# Patient Record
Sex: Female | Born: 1970 | Race: White | Hispanic: No | Smoking: Current every day smoker
Health system: Southern US, Community
[De-identification: ages and names within clinical notes are randomized; demographics above are authoritative.]

## PROBLEM LIST (undated history)

## (undated) DIAGNOSIS — IMO0001 Reserved for inherently not codable concepts without codable children: Secondary | ICD-10-CM

## (undated) DIAGNOSIS — K56609 Unspecified intestinal obstruction, unspecified as to partial versus complete obstruction: Secondary | ICD-10-CM

## (undated) DIAGNOSIS — K219 Gastro-esophageal reflux disease without esophagitis: Secondary | ICD-10-CM

## (undated) DIAGNOSIS — F32A Depression, unspecified: Secondary | ICD-10-CM

## (undated) DIAGNOSIS — N200 Calculus of kidney: Secondary | ICD-10-CM

## (undated) DIAGNOSIS — J189 Pneumonia, unspecified organism: Secondary | ICD-10-CM

## (undated) DIAGNOSIS — F4310 Post-traumatic stress disorder, unspecified: Secondary | ICD-10-CM

## (undated) DIAGNOSIS — F329 Major depressive disorder, single episode, unspecified: Secondary | ICD-10-CM

## (undated) HISTORY — PX: ABDOMINAL SURGERY: SHX537

## (undated) HISTORY — PX: APPENDECTOMY: SHX54

---

## 2005-11-04 ENCOUNTER — Inpatient Hospital Stay (HOSPITAL_COMMUNITY): Admission: AD | Admit: 2005-11-04 | Discharge: 2005-11-06 | Payer: Self-pay | Admitting: Obstetrics and Gynecology

## 2006-01-15 ENCOUNTER — Other Ambulatory Visit: Admission: RE | Admit: 2006-01-15 | Discharge: 2006-01-15 | Payer: Self-pay | Admitting: Obstetrics and Gynecology

## 2006-04-12 ENCOUNTER — Emergency Department (HOSPITAL_COMMUNITY): Admission: EM | Admit: 2006-04-12 | Discharge: 2006-04-12 | Payer: Self-pay | Admitting: Emergency Medicine

## 2007-09-21 ENCOUNTER — Ambulatory Visit (HOSPITAL_COMMUNITY): Admission: RE | Admit: 2007-09-21 | Discharge: 2007-09-21 | Payer: Self-pay | Admitting: Obstetrics and Gynecology

## 2007-09-22 ENCOUNTER — Encounter (INDEPENDENT_AMBULATORY_CARE_PROVIDER_SITE_OTHER): Payer: Self-pay | Admitting: Obstetrics & Gynecology

## 2007-09-22 ENCOUNTER — Ambulatory Visit (HOSPITAL_COMMUNITY): Admission: RE | Admit: 2007-09-22 | Discharge: 2007-09-22 | Payer: Self-pay | Admitting: Obstetrics & Gynecology

## 2008-06-08 ENCOUNTER — Ambulatory Visit (HOSPITAL_COMMUNITY): Admission: RE | Admit: 2008-06-08 | Discharge: 2008-06-08 | Payer: Self-pay | Admitting: Obstetrics and Gynecology

## 2008-11-30 IMAGING — CT CT ABDOMEN W/ CM
3 of 5 series · 14 of 32 positions shown, 19 images · IV contrast ([ID] READICAT & [ID] OMNIP 300%)
Comparison: Noncontrast CT on 04/12/2006

CT ABDOMEN

CLINICAL DATA: Left lower quadrant abdominal pain.  Left-sided
pelvic pain.  Abdominal distension and previous bowel obstructions.
Prior appendectomy.

CT ABDOMEN AND PELVIS WITH CONTRAST
TECHNIQUE: Multidetector CT imaging of the abdomen and pelvis was
performed using the standard protocol following bolus
administration of intravenous contrast.
Contrast: 100 ml Nmnipaque-5II and oral contrast

[Series 2: abd pelvis · axial · 0.73mm/px · z∈[-394,-124]mm · 4 of 92 slices shown, 9 images]
[im 19/92  soft-tissue]
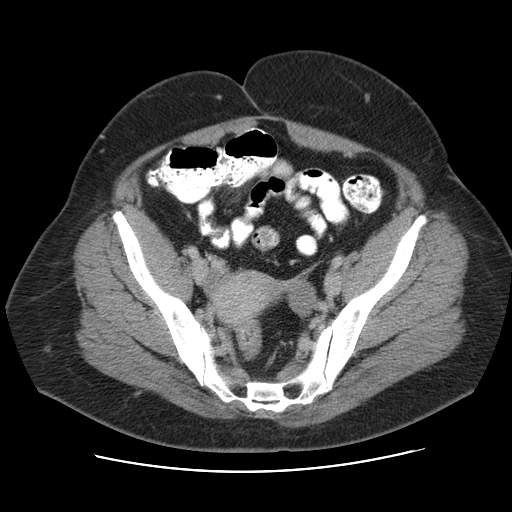
[im 19/92  lung]
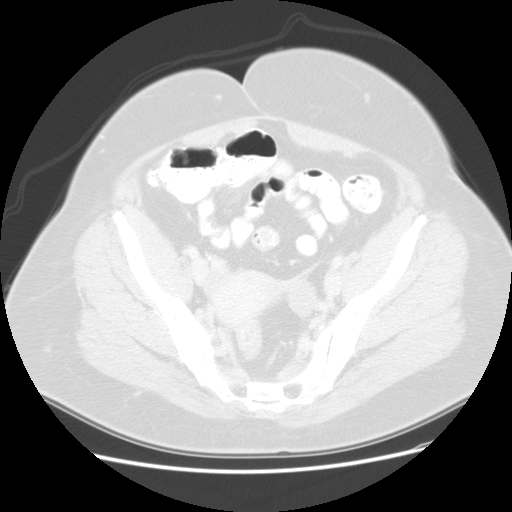
[im 19/92  bone]
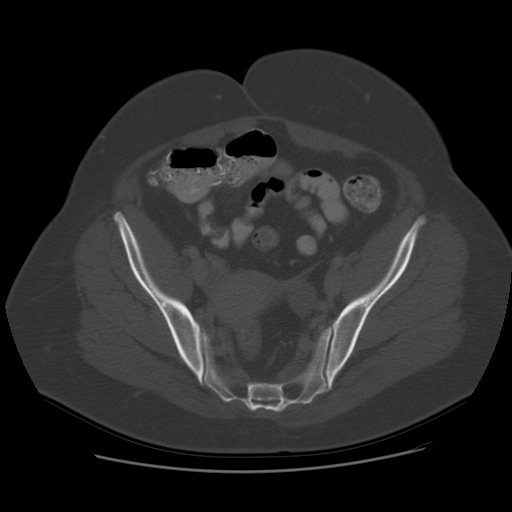
[im 37/92  soft-tissue]
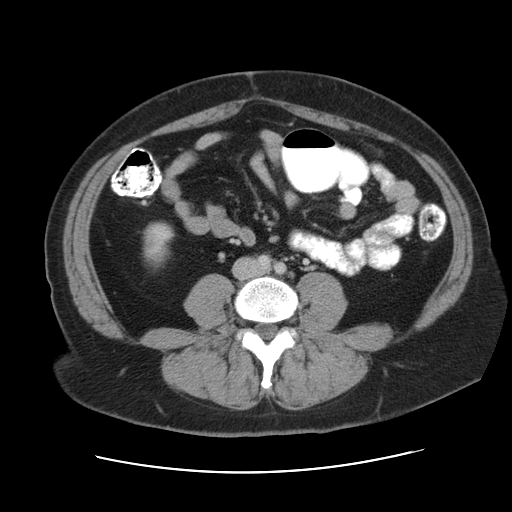
[im 37/92  lung]
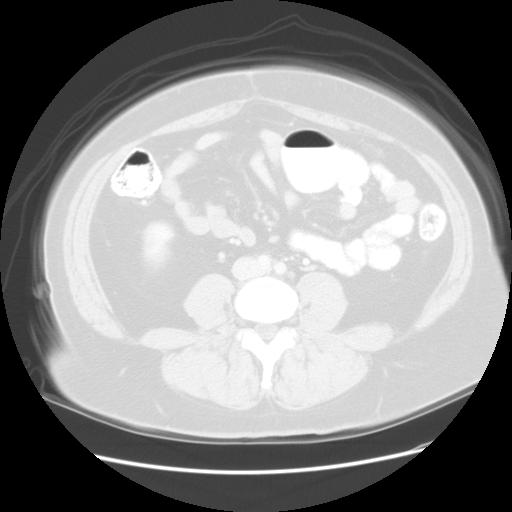
[im 55/92  soft-tissue]
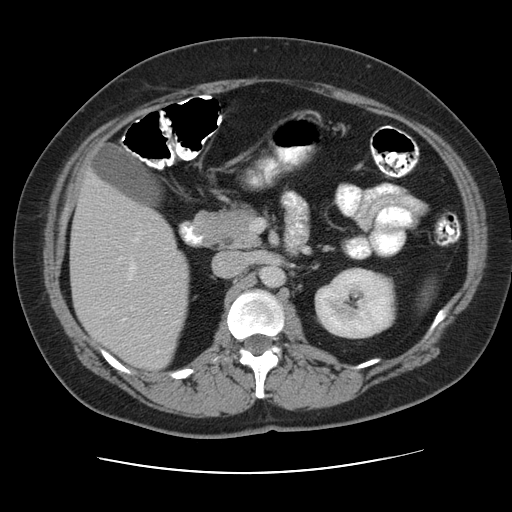
[im 55/92  lung]
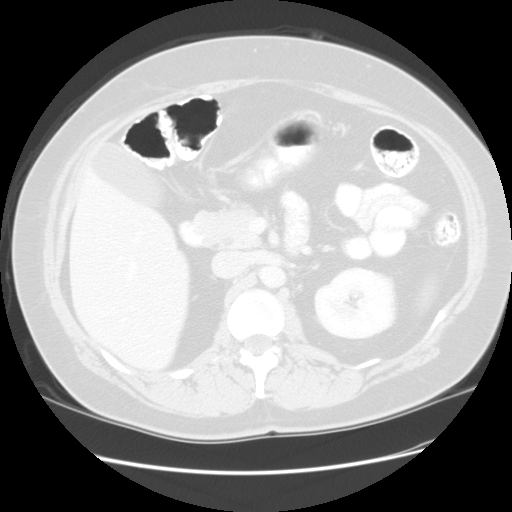
[im 73/92  soft-tissue]
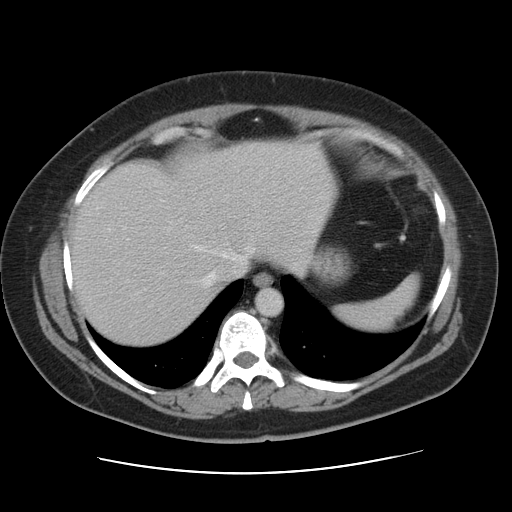
[im 73/92  lung]
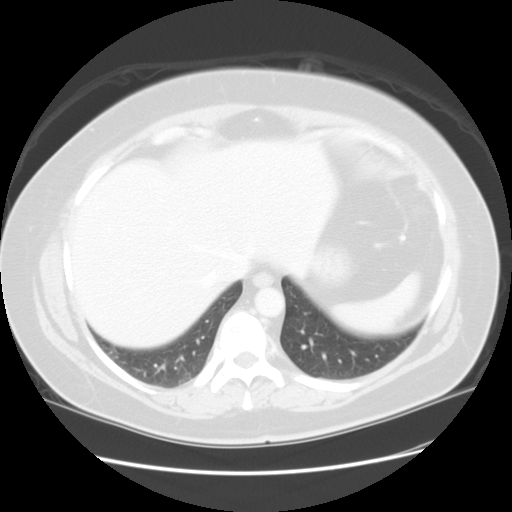

[Series 400: reformatted · coronal · 0.95mm/px · 2 of 133 slices shown (1 of 2)]
[im 15/133  soft-tissue]
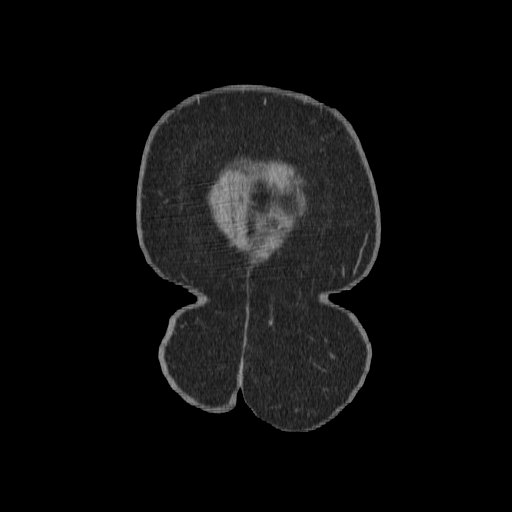
[im 30/133  soft-tissue]
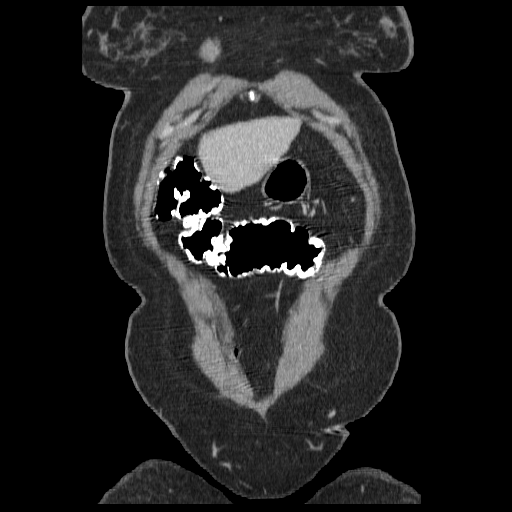

[Series 401: reformatted · sagittal · 0.95mm/px · 8 of 163 slices shown (2 of 2)]
[im 15/163  soft-tissue]
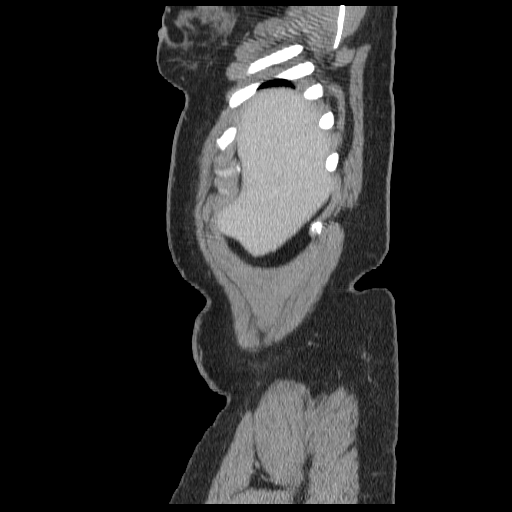
[im 30/163  soft-tissue]
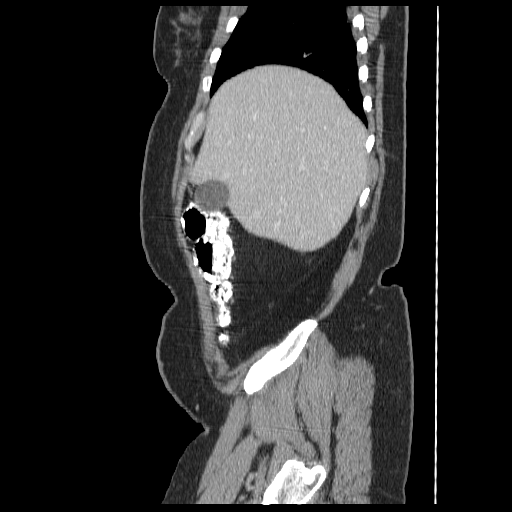
[im 59/163  soft-tissue]
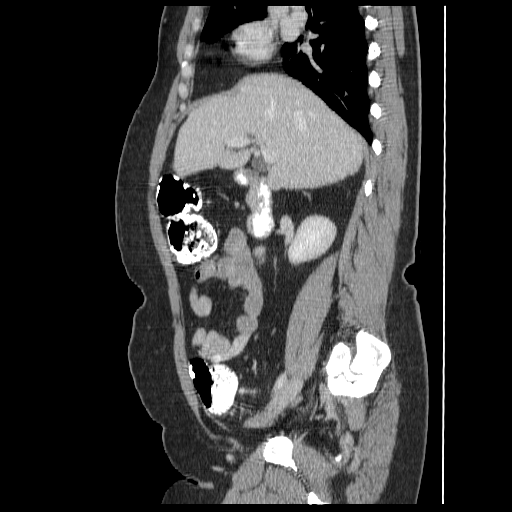
[im 74/163  soft-tissue]
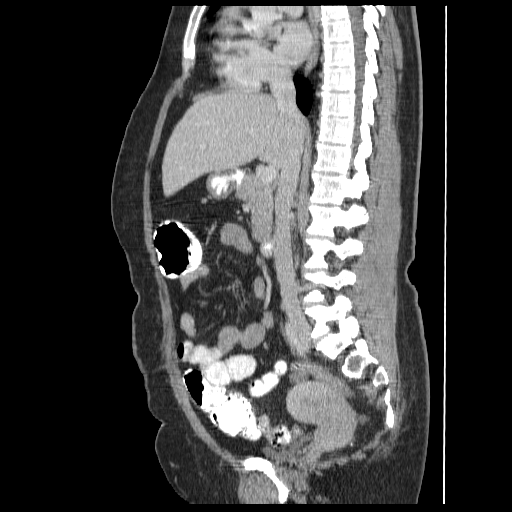
[im 89/163  soft-tissue]
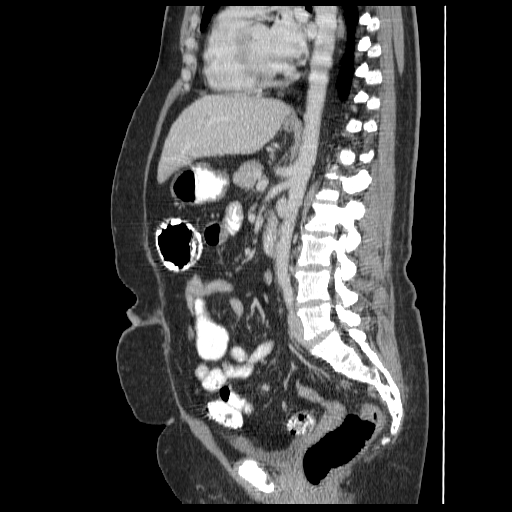
[im 104/163  soft-tissue]
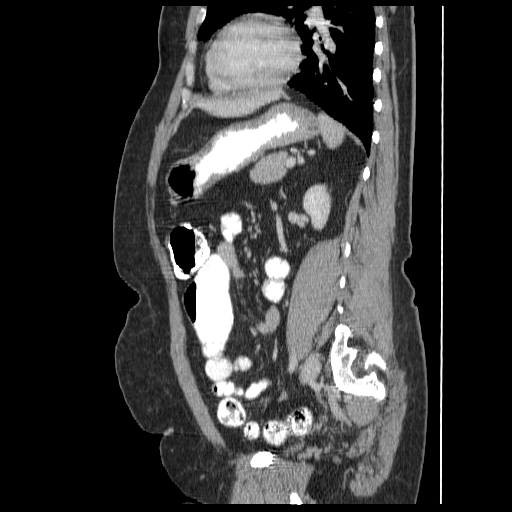
[im 133/163  soft-tissue]
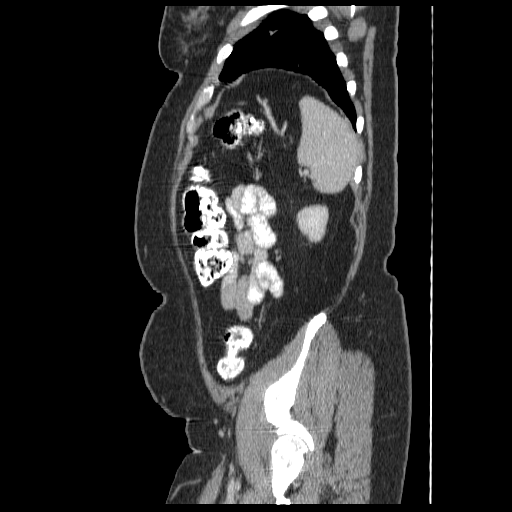
[im 148/163  soft-tissue]
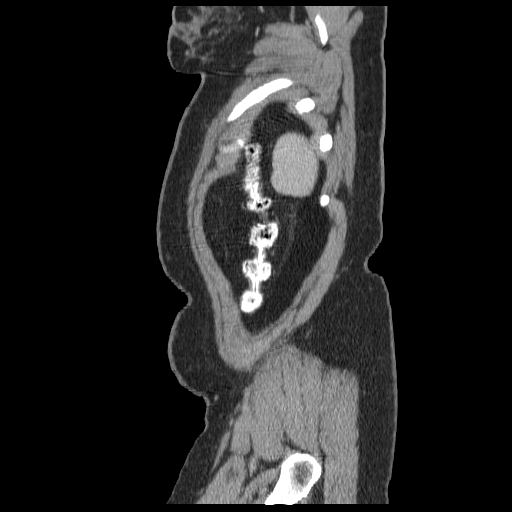

[14 of 32 positions shown; findings below may reference images not displayed]

FINDINGS: Several tiny nonobstructing calculi are again noted in
the lower pole of the left kidney.  There is no evidence of
hydronephrosis or renal mass.  The other abdominal parenchymal
organs are normal in appearance as well as the gallbladder.  There
is no evidence of soft tissue masses or lymphadenopathy.  There is
no evidence of inflammatory process or abnormal fluid collections.
The abdominal bowel loops are nondilated unremarkable appearance.
IMPRESSION: 1.  No acute findings.
2.  Several tiny nonobstructing calculi in the lower pole of the
left kidney.

CT PELVIS
FINDINGS: Pelvic bowel loops are nondilated, there is no evidence
of obstruction or hernia.  There is no evidence of pelvic mass or
lymphadenopathy.  There is no evidence of inflammatory process or
abnormal fluid collections.
IMPRESSION: Negative pelvis CT.

## 2009-07-23 ENCOUNTER — Ambulatory Visit (HOSPITAL_COMMUNITY): Admission: RE | Admit: 2009-07-23 | Discharge: 2009-07-23 | Payer: Self-pay | Admitting: Obstetrics and Gynecology

## 2009-07-23 ENCOUNTER — Encounter (INDEPENDENT_AMBULATORY_CARE_PROVIDER_SITE_OTHER): Payer: Self-pay | Admitting: Obstetrics and Gynecology

## 2010-10-23 ENCOUNTER — Encounter
Admission: RE | Admit: 2010-10-23 | Discharge: 2010-10-23 | Payer: Self-pay | Source: Home / Self Care | Attending: Internal Medicine | Admitting: Internal Medicine

## 2010-11-09 ENCOUNTER — Encounter: Payer: Self-pay | Admitting: Obstetrics and Gynecology

## 2010-11-26 ENCOUNTER — Ambulatory Visit
Admission: RE | Admit: 2010-11-26 | Discharge: 2010-11-26 | Disposition: A | Discharge status: Home / Self Care | Payer: Medicaid Other | Source: Ambulatory Visit | Attending: Physical Medicine and Rehabilitation | Admitting: Physical Medicine and Rehabilitation

## 2010-11-26 ENCOUNTER — Encounter: Payer: Medicaid Other | Attending: Physical Medicine and Rehabilitation

## 2010-11-26 ENCOUNTER — Ambulatory Visit (HOSPITAL_BASED_OUTPATIENT_CLINIC_OR_DEPARTMENT_OTHER): Payer: Medicaid Other | Admitting: Physical Medicine and Rehabilitation

## 2010-11-26 ENCOUNTER — Other Ambulatory Visit: Payer: Self-pay | Admitting: Physical Medicine and Rehabilitation

## 2010-11-26 DIAGNOSIS — M549 Dorsalgia, unspecified: Secondary | ICD-10-CM

## 2010-11-26 DIAGNOSIS — M5380 Other specified dorsopathies, site unspecified: Secondary | ICD-10-CM | POA: Insufficient documentation

## 2010-11-26 DIAGNOSIS — M539 Dorsopathy, unspecified: Secondary | ICD-10-CM | POA: Insufficient documentation

## 2010-11-26 DIAGNOSIS — G894 Chronic pain syndrome: Secondary | ICD-10-CM

## 2010-11-26 DIAGNOSIS — M546 Pain in thoracic spine: Secondary | ICD-10-CM

## 2010-11-26 DIAGNOSIS — I10 Essential (primary) hypertension: Secondary | ICD-10-CM | POA: Insufficient documentation

## 2010-12-08 ENCOUNTER — Ambulatory Visit: Payer: Medicaid Other | Admitting: Physical Medicine and Rehabilitation

## 2010-12-08 ENCOUNTER — Ambulatory Visit (HOSPITAL_BASED_OUTPATIENT_CLINIC_OR_DEPARTMENT_OTHER): Payer: Medicaid Other | Admitting: Physical Medicine and Rehabilitation

## 2010-12-08 DIAGNOSIS — M542 Cervicalgia: Secondary | ICD-10-CM

## 2010-12-08 DIAGNOSIS — G894 Chronic pain syndrome: Secondary | ICD-10-CM

## 2010-12-08 DIAGNOSIS — M549 Dorsalgia, unspecified: Secondary | ICD-10-CM

## 2011-01-22 LAB — CBC
MCHC: 33.9 g/dL (ref 30.0–36.0)
WBC: 10.4 10*3/uL (ref 4.0–10.5)

## 2011-02-10 ENCOUNTER — Encounter: Payer: Medicaid Other | Attending: Neurosurgery | Admitting: Neurosurgery

## 2011-02-10 DIAGNOSIS — M549 Dorsalgia, unspecified: Secondary | ICD-10-CM

## 2011-02-10 DIAGNOSIS — F17200 Nicotine dependence, unspecified, uncomplicated: Secondary | ICD-10-CM | POA: Insufficient documentation

## 2011-02-10 DIAGNOSIS — M546 Pain in thoracic spine: Secondary | ICD-10-CM | POA: Insufficient documentation

## 2011-02-10 DIAGNOSIS — M12529 Traumatic arthropathy, unspecified elbow: Secondary | ICD-10-CM
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
## 2011-02-19 ENCOUNTER — Ambulatory Visit: Payer: Medicaid Other | Admitting: Neurosurgery

## 2011-02-20 ENCOUNTER — Ambulatory Visit: Payer: Medicaid Other | Admitting: Physical Medicine and Rehabilitation

## 2011-03-03 NOTE — Op Note (Signed)
Debra Barnes, Debra Barnes               ACCOUNT NO.:  [id number]   MEDICAL RECORD NO.:  [id number]          PATIENT TYPE:  AMB   LOCATION:  SDC                           FACILITY:  [facility]   PHYSICIAN:  Freddy Finner, M.D.   DATE OF BIRTH:  [birth year]   DATE OF PROCEDURE:  09/22/2007  DATE OF DISCHARGE:                               OPERATIVE REPORT   PREOPERATIVE DIAGNOSIS:  Second trimester intrauterine fetal demise.   POSTOPERATIVE DIAGNOSIS:  Second trimester intrauterine fetal demise.   OPERATIVE PROCEDURE:  Dilation and evacuation.   SURGEON:  Freddy Finner, M.D.   ANESTHESIA:  General.   ESTIMATED INTRAOPERATIVE BLOOD LOSS:  200 cc.   INTRAOPERATIVE COMPLICATIONS:  None.   Patient is a 41 year old who was found to have an intrauterine fetal  demise at approximately 15-[redacted] weeks gestation.  She was seen on the day  prior to surgery in the late afternoon, at which time thick laminaria  was placed into the cervix.  She is admitted now for D&E.  She was  admitted on the morning of surgery.  She was given a bolus of Ancef IV  preoperatively.  She was taken to the operating room and placed under  adequate general anesthesia, placed in the dorsal lithotomy position.  Betadine prep of the mons, perineum, and the vagina was carried out, and  a sponge pack and laminaria removed from the cervix and vagina.  Sterile  drapes were applied.  The cervix was visualized using a bivalve  speculum.  The anterior cervical lip was grasped with a ringed forceps  to avoid tearing the cervix.  The cervix was easily dilated to allow  passage of a 14 mm curved suction cannula.  The cannula was passed, and  aspiration produced obvious products of conception.  This was continued  until it was seen that the cavity was evacuated.  Gentle sharp curettage  and expiration with placental type forceps was carried out.  Repeat  vacuum aspiration was carried out.  Again, the cavity seemed to be  completely  evacuated.  Products of conception were inspected, and all  were accounted for, including the skull.  The procedure was then  terminated.  She was taken to the recovery room in good condition.  She  will be given routine outpatient surgical instructions.  She is to  follow up in the office in 10-14 days.  She is to call for fever or  heavy bleeding.  She has Percocet to be taken as needed for  postoperative pain.  She has Xanax to be taken as needed for anxiety and  sleep disturbance.      Freddy Finner, M.D.  Electronically Signed     WRN/MEDQ  D:  09/22/2007  T:  09/22/2007  Job:  [id number]

## 2011-03-10 ENCOUNTER — Encounter: Payer: Self-pay | Admitting: Neurosurgery

## 2011-04-07 ENCOUNTER — Emergency Department (HOSPITAL_BASED_OUTPATIENT_CLINIC_OR_DEPARTMENT_OTHER)
Admission: EM | Admit: 2011-04-07 | Discharge: 2011-04-07 | Disposition: A | Discharge status: Home / Self Care | Payer: Self-pay | Attending: Emergency Medicine | Admitting: Emergency Medicine

## 2011-04-07 ENCOUNTER — Emergency Department (INDEPENDENT_AMBULATORY_CARE_PROVIDER_SITE_OTHER): Payer: Self-pay

## 2011-04-07 DIAGNOSIS — M25569 Pain in unspecified knee: Secondary | ICD-10-CM

## 2011-04-07 DIAGNOSIS — W101XXA Fall (on)(from) sidewalk curb, initial encounter: Secondary | ICD-10-CM | POA: Insufficient documentation

## 2011-04-07 DIAGNOSIS — IMO0002 Reserved for concepts with insufficient information to code with codable children: Secondary | ICD-10-CM | POA: Insufficient documentation

## 2011-04-07 DIAGNOSIS — F17200 Nicotine dependence, unspecified, uncomplicated: Secondary | ICD-10-CM | POA: Insufficient documentation

## 2011-04-07 DIAGNOSIS — W19XXXA Unspecified fall, initial encounter: Secondary | ICD-10-CM

## 2011-07-27 LAB — CBC
HCT: 37
Hemoglobin: 12.9
MCHC: 34.7
MCV: 95.5

## 2013-01-10 ENCOUNTER — Emergency Department (HOSPITAL_BASED_OUTPATIENT_CLINIC_OR_DEPARTMENT_OTHER)
Admission: EM | Admit: 2013-01-10 | Discharge: 2013-01-10 | Disposition: A | Discharge status: Home / Self Care | Payer: No Typology Code available for payment source | Attending: Emergency Medicine | Admitting: Emergency Medicine

## 2013-01-10 ENCOUNTER — Emergency Department (HOSPITAL_BASED_OUTPATIENT_CLINIC_OR_DEPARTMENT_OTHER): Payer: No Typology Code available for payment source

## 2013-01-10 ENCOUNTER — Encounter (HOSPITAL_BASED_OUTPATIENT_CLINIC_OR_DEPARTMENT_OTHER): Payer: Self-pay | Admitting: *Deleted

## 2013-01-10 DIAGNOSIS — R0989 Other specified symptoms and signs involving the circulatory and respiratory systems: Secondary | ICD-10-CM | POA: Insufficient documentation

## 2013-01-10 DIAGNOSIS — Z8701 Personal history of pneumonia (recurrent): Secondary | ICD-10-CM | POA: Insufficient documentation

## 2013-01-10 DIAGNOSIS — I999 Unspecified disorder of circulatory system: Secondary | ICD-10-CM

## 2013-01-10 DIAGNOSIS — Z8719 Personal history of other diseases of the digestive system: Secondary | ICD-10-CM | POA: Insufficient documentation

## 2013-01-10 DIAGNOSIS — S0083XA Contusion of other part of head, initial encounter: Secondary | ICD-10-CM

## 2013-01-10 DIAGNOSIS — Z79899 Other long term (current) drug therapy: Secondary | ICD-10-CM | POA: Insufficient documentation

## 2013-01-10 DIAGNOSIS — F3289 Other specified depressive episodes: Secondary | ICD-10-CM | POA: Insufficient documentation

## 2013-01-10 DIAGNOSIS — Z87442 Personal history of urinary calculi: Secondary | ICD-10-CM | POA: Insufficient documentation

## 2013-01-10 DIAGNOSIS — S0003XA Contusion of scalp, initial encounter: Secondary | ICD-10-CM | POA: Insufficient documentation

## 2013-01-10 DIAGNOSIS — Z8659 Personal history of other mental and behavioral disorders: Secondary | ICD-10-CM | POA: Insufficient documentation

## 2013-01-10 DIAGNOSIS — F329 Major depressive disorder, single episode, unspecified: Secondary | ICD-10-CM | POA: Insufficient documentation

## 2013-01-10 DIAGNOSIS — F17200 Nicotine dependence, unspecified, uncomplicated: Secondary | ICD-10-CM | POA: Insufficient documentation

## 2013-01-10 HISTORY — DX: Reserved for inherently not codable concepts without codable children: IMO0001

## 2013-01-10 HISTORY — DX: Pneumonia, unspecified organism: J18.9

## 2013-01-10 HISTORY — DX: Depression, unspecified: F32.A

## 2013-01-10 HISTORY — DX: Calculus of kidney: N20.0

## 2013-01-10 HISTORY — DX: Major depressive disorder, single episode, unspecified: F32.9

## 2013-01-10 HISTORY — DX: Gastro-esophageal reflux disease without esophagitis: K21.9

## 2013-01-10 HISTORY — DX: Post-traumatic stress disorder, unspecified: F43.10

## 2013-01-10 MED ORDER — HYDROCODONE-ACETAMINOPHEN 5-325 MG PO TABS
2.0000 | ORAL_TABLET | Freq: Once | ORAL | Status: AC
  Administered 2013-01-10: 2 via ORAL
  Filled 2013-01-10: qty 2

## 2013-01-10 MED ORDER — AZITHROMYCIN 1 G PO PACK
PACK | ORAL | Status: AC
  Administered 2013-01-10: 1 g via ORAL
  Filled 2013-01-10: qty 1

## 2013-01-10 MED ORDER — METRONIDAZOLE 500 MG PO TABS
ORAL_TABLET | ORAL | Status: AC
  Administered 2013-01-10: 19:00:00
  Filled 2013-01-10: qty 4

## 2013-01-10 MED ORDER — HYDROCODONE-ACETAMINOPHEN 5-325 MG PO TABS: 2.0000 | ORAL_TABLET | ORAL | Status: DC | PRN

## 2013-01-10 MED ORDER — HYDROCODONE-ACETAMINOPHEN 5-325 MG PO TABS: 2.0000 | ORAL_TABLET | Freq: Once | ORAL | Status: DC

## 2013-01-10 MED ORDER — PROMETHAZINE HCL 25 MG PO TABS
ORAL_TABLET | ORAL | Status: AC
  Administered 2013-01-10: 19:00:00
  Filled 2013-01-10: qty 3

## 2013-01-10 MED ORDER — CEFIXIME 400 MG PO TABS
ORAL_TABLET | ORAL | Status: AC
  Administered 2013-01-10: 400 mg via ORAL
  Filled 2013-01-10: qty 1

## 2013-01-10 MED ORDER — LEVONORGESTREL 0.75 MG PO TABS
ORAL_TABLET | ORAL | Status: AC
  Administered 2013-01-10: 19:00:00
  Filled 2013-01-10: qty 2
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
## 2013-01-10 NOTE — ED Provider Notes (Signed)
Medical screening examination/treatment/procedure(s) were performed by non-physician practitioner and as supervising physician I was immediately available for consultation/collaboration.   Carleene Cooper III, MD 01/10/13 [phone]
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
## 2013-01-10 NOTE — ED Notes (Signed)
SANE RN here with pt.
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
## 2013-01-10 NOTE — ED Notes (Signed)
Sane Nurse paged, Annice Pih, RN on call.
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
## 2013-01-12 ENCOUNTER — Other Ambulatory Visit (HOSPITAL_BASED_OUTPATIENT_CLINIC_OR_DEPARTMENT_OTHER): Payer: Self-pay | Admitting: Physician Assistant

## 2013-01-12 DIAGNOSIS — R9089 Other abnormal findings on diagnostic imaging of central nervous system: Secondary | ICD-10-CM

## 2013-01-14 ENCOUNTER — Ambulatory Visit (HOSPITAL_BASED_OUTPATIENT_CLINIC_OR_DEPARTMENT_OTHER)
Admission: RE | Admit: 2013-01-14 | Discharge: 2013-01-14 | Disposition: A | Discharge status: Home / Self Care | Payer: Self-pay | Source: Ambulatory Visit | Attending: Physician Assistant | Admitting: Physician Assistant

## 2013-01-14 ENCOUNTER — Ambulatory Visit (HOSPITAL_BASED_OUTPATIENT_CLINIC_OR_DEPARTMENT_OTHER)
Admit: 2013-01-14 | Discharge: 2013-01-14 | Disposition: A | Discharge status: Home / Self Care | Payer: Medicaid Other | Attending: Emergency Medicine | Admitting: Emergency Medicine

## 2013-01-14 DIAGNOSIS — R51 Headache: Secondary | ICD-10-CM | POA: Insufficient documentation

## 2013-01-14 DIAGNOSIS — R42 Dizziness and giddiness: Secondary | ICD-10-CM | POA: Insufficient documentation

## 2013-01-14 DIAGNOSIS — R9409 Abnormal results of other function studies of central nervous system: Secondary | ICD-10-CM | POA: Insufficient documentation

## 2013-01-14 DIAGNOSIS — R5383 Other fatigue: Secondary | ICD-10-CM | POA: Insufficient documentation

## 2013-01-14 DIAGNOSIS — R9089 Other abnormal findings on diagnostic imaging of central nervous system: Secondary | ICD-10-CM

## 2013-01-14 DIAGNOSIS — D1802 Hemangioma of intracranial structures: Secondary | ICD-10-CM | POA: Insufficient documentation

## 2013-01-14 DIAGNOSIS — R5381 Other malaise: Secondary | ICD-10-CM | POA: Insufficient documentation

## 2013-01-14 MED ORDER — GADOBENATE DIMEGLUMINE 529 MG/ML IV SOLN
14.0000 mL | Freq: Once | INTRAVENOUS | Status: AC | PRN
  Administered 2013-01-14: 14 mL via INTRAVENOUS

## 2013-01-18 NOTE — ED Provider Notes (Signed)
Medical screening examination/treatment/procedure(s) were performed by non-physician practitioner and as supervising physician I was immediately available for consultation/collaboration.   Carleene Cooper III, MD 01/18/13 2141

## 2013-11-07 ENCOUNTER — Emergency Department (HOSPITAL_BASED_OUTPATIENT_CLINIC_OR_DEPARTMENT_OTHER): Payer: Medicaid Other

## 2013-11-07 ENCOUNTER — Emergency Department (HOSPITAL_BASED_OUTPATIENT_CLINIC_OR_DEPARTMENT_OTHER)
Admission: EM | Admit: 2013-11-07 | Discharge: 2013-11-07 | Disposition: A | Discharge status: Home / Self Care | Payer: Medicaid Other | Attending: Emergency Medicine | Admitting: Emergency Medicine

## 2013-11-07 ENCOUNTER — Encounter (HOSPITAL_BASED_OUTPATIENT_CLINIC_OR_DEPARTMENT_OTHER): Payer: Self-pay | Admitting: Emergency Medicine

## 2013-11-07 DIAGNOSIS — IMO0002 Reserved for concepts with insufficient information to code with codable children: Secondary | ICD-10-CM | POA: Insufficient documentation

## 2013-11-07 DIAGNOSIS — Z79899 Other long term (current) drug therapy: Secondary | ICD-10-CM | POA: Insufficient documentation

## 2013-11-07 DIAGNOSIS — F329 Major depressive disorder, single episode, unspecified: Secondary | ICD-10-CM | POA: Insufficient documentation

## 2013-11-07 DIAGNOSIS — Y939 Activity, unspecified: Secondary | ICD-10-CM | POA: Insufficient documentation

## 2013-11-07 DIAGNOSIS — S60221A Contusion of right hand, initial encounter: Secondary | ICD-10-CM

## 2013-11-07 DIAGNOSIS — F17200 Nicotine dependence, unspecified, uncomplicated: Secondary | ICD-10-CM | POA: Insufficient documentation

## 2013-11-07 DIAGNOSIS — Z8701 Personal history of pneumonia (recurrent): Secondary | ICD-10-CM | POA: Insufficient documentation

## 2013-11-07 DIAGNOSIS — F3289 Other specified depressive episodes: Secondary | ICD-10-CM | POA: Insufficient documentation

## 2013-11-07 DIAGNOSIS — S40019A Contusion of unspecified shoulder, initial encounter: Secondary | ICD-10-CM | POA: Insufficient documentation

## 2013-11-07 DIAGNOSIS — Z87442 Personal history of urinary calculi: Secondary | ICD-10-CM | POA: Insufficient documentation

## 2013-11-07 DIAGNOSIS — Y929 Unspecified place or not applicable: Secondary | ICD-10-CM | POA: Insufficient documentation

## 2013-11-07 DIAGNOSIS — Z8719 Personal history of other diseases of the digestive system: Secondary | ICD-10-CM | POA: Insufficient documentation

## 2013-11-07 MED ORDER — HYDROCODONE-ACETAMINOPHEN 5-325 MG PO TABS: 2.0000 | ORAL_TABLET | ORAL | Status: DC | PRN

## 2013-11-07 MED ORDER — HYDROCODONE-ACETAMINOPHEN 5-325 MG PO TABS: 2.0000 | ORAL_TABLET | Freq: Once | ORAL | Status: DC

## 2013-11-07 NOTE — ED Provider Notes (Signed)
CSN: [id number]     Arrival date & time 11/07/13  1904 History   First MD Initiated Contact with Patient 11/07/13 1927     Chief Complaint  Patient presents with  . Hand Injury   (Consider location/radiation/quality/duration/timing/severity/associated sxs/prior Treatment) Patient is a 43 y.o. female presenting with hand injury. The history is provided by the patient.  Hand Injury Location:  Hand Time since incident:  1 day Injury: yes   Hand location:  R hand Pain details:    Severity:  No pain   Timing:  Constant Relieved by:  Nothing Ineffective treatments:  None tried   Past Medical History  Diagnosis Date  . Pneumonia   . Reflux   . Kidney stones   . Depression   . PTSD (post-traumatic stress disorder)    Past Surgical History  Procedure Laterality Date  . Abdominal surgery     No family history on file. History  Substance Use Topics  . Smoking status: Current Every Day Smoker -- 1.00 packs/day    Types: Cigarettes  . Smokeless tobacco: Never Used  . Alcohol Use: No   OB History   Grav Para Term Preterm Abortions TAB SAB Ect Mult Living                 Review of Systems  All other systems reviewed and are negative.    Allergies  Penicillins and Sulfa antibiotics  Home Medications   Current Outpatient Rx  Name  Route  Sig  Dispense  Refill  . HYDROcodone-acetaminophen (NORCO/VICODIN) 5-325 MG per tablet   Oral   Take 2 tablets by mouth every 4 (four) hours as needed for pain.   10 tablet   0   . venlafaxine XR (EFFEXOR-XR) 150 MG 24 hr capsule   Oral   Take 150 mg by mouth daily.          BP 164/98  Pulse 89  Temp(Src) 98.2 F (36.8 C) (Oral)  Resp 20  Ht 5\' 4"  (1.626 m)  Wt 156 lb (70.761 kg)  BMI 26.76 kg/m2  SpO2 99%  LMP 11/06/2013 Physical Exam  Vitals reviewed. Constitutional: She is oriented to person, place, and time. She appears well-developed and well-nourished.  HENT:  Head: Normocephalic.  Musculoskeletal: She  exhibits tenderness.  Tender 5th metacarpal,  Neurological: She is alert and oriented to person, place, and time. She has normal reflexes.  Skin: Skin is warm.  Psychiatric: She has a normal mood and affect.    ED Course  Procedures (including critical care time) Labs Review Labs Reviewed - No data to display Imaging Review Dg Hand Complete Right  11/07/2013   CLINICAL DATA:  Injury, pain  EXAM: RIGHT HAND - COMPLETE 3+ VIEW  COMPARISON:  None.  FINDINGS: There is no evidence of fracture or dislocation. There is no evidence of arthropathy or other focal bone abnormality. Soft tissues are unremarkable.  IMPRESSION: Negative.   Electronically Signed   By: Daryll Brod M.D.   On: 11/07/2013 19:44    EKG Interpretation   None       MDM  No diagnosis found. Splint, hydrocodone,  Follow up with Dr. Bradd Burner, PA-C 11/07/13 [phone]

## 2013-11-07 NOTE — ED Provider Notes (Signed)
Medical screening examination/treatment/procedure(s) were performed by non-physician practitioner and as supervising physician I was immediately available for consultation/collaboration.     Veryl Speak, MD 11/07/13 2306
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
## 2013-11-07 NOTE — Discharge Instructions (Signed)
Hand Contusion °A hand contusion is a deep bruise on your hand area. Contusions are the result of an injury that caused bleeding under the skin. The contusion may turn blue, purple, or yellow. Minor injuries will give you a painless contusion, but more severe contusions may stay painful and swollen for a few weeks. °CAUSES  °A contusion is usually caused by a blow, trauma, or direct force to an area of the body. °SYMPTOMS  °· Swelling and redness of the injured area. °· Discoloration of the injured area. °· Tenderness and soreness of the injured area. °· Pain. °DIAGNOSIS  °The diagnosis can be made by taking a history and performing a physical exam. An X-ray, CT scan, or MRI may be needed to determine if there were any associated injuries, such as broken bones (fractures). °TREATMENT  °Often, the best treatment for a hand contusion is resting, elevating, icing, and applying cold compresses to the injured area. Over-the-counter medicines may also be recommended for pain control. °HOME CARE INSTRUCTIONS  °· Put ice on the injured area. °· Put ice in a plastic bag. °· Place a towel between your skin and the bag. °· Leave the ice on for 15-20 minutes, 03-04 times a day. °· Only take over-the-counter or prescription medicines as directed by your caregiver. Your caregiver may recommend avoiding anti-inflammatory medicines (aspirin, ibuprofen, and naproxen) for 48 hours because these medicines may increase bruising. °· If told, use an elastic wrap as directed. This can help reduce swelling. You may remove the wrap for sleeping, showering, and bathing. If your fingers become numb, cold, or blue, take the wrap off and reapply it more loosely. °· Elevate your hand with pillows to reduce swelling. °· Avoid overusing your hand if it is painful. °SEEK IMMEDIATE MEDICAL CARE IF:  °· You have increased redness, swelling, or pain in your hand. °· Your swelling or pain is not relieved with medicines. °· You have loss of feeling in  your hand or are unable to move your fingers. °· Your hand turns cold or blue. °· You have pain when you move your fingers. °· Your hand becomes warm to the touch. °· Your contusion does not improve in 2 days. °MAKE SURE YOU:  °· Understand these instructions. °· Will watch your condition. °· Will get help right away if you are not doing well or get worse. °Document Released: 03/27/2002 Document Revised: 06/29/2012 Document Reviewed: 03/28/2012 °[employer]® Patient Information ©2014 [employer]. ° °

## 2013-12-15 ENCOUNTER — Encounter (HOSPITAL_BASED_OUTPATIENT_CLINIC_OR_DEPARTMENT_OTHER): Payer: Self-pay | Admitting: Emergency Medicine

## 2013-12-15 ENCOUNTER — Emergency Department (HOSPITAL_BASED_OUTPATIENT_CLINIC_OR_DEPARTMENT_OTHER)
Admission: EM | Admit: 2013-12-15 | Discharge: 2013-12-15 | Disposition: A | Discharge status: Home / Self Care | Payer: Medicaid Other | Attending: Emergency Medicine | Admitting: Emergency Medicine

## 2013-12-15 DIAGNOSIS — R05 Cough: Secondary | ICD-10-CM | POA: Insufficient documentation

## 2013-12-15 DIAGNOSIS — Z8701 Personal history of pneumonia (recurrent): Secondary | ICD-10-CM | POA: Insufficient documentation

## 2013-12-15 DIAGNOSIS — R059 Cough, unspecified: Secondary | ICD-10-CM | POA: Insufficient documentation

## 2013-12-15 DIAGNOSIS — F329 Major depressive disorder, single episode, unspecified: Secondary | ICD-10-CM | POA: Insufficient documentation

## 2013-12-15 DIAGNOSIS — F17200 Nicotine dependence, unspecified, uncomplicated: Secondary | ICD-10-CM | POA: Insufficient documentation

## 2013-12-15 DIAGNOSIS — Z87442 Personal history of urinary calculi: Secondary | ICD-10-CM | POA: Insufficient documentation

## 2013-12-15 DIAGNOSIS — F3289 Other specified depressive episodes: Secondary | ICD-10-CM | POA: Insufficient documentation

## 2013-12-15 DIAGNOSIS — Z79899 Other long term (current) drug therapy: Secondary | ICD-10-CM | POA: Insufficient documentation

## 2013-12-15 DIAGNOSIS — H60399 Other infective otitis externa, unspecified ear: Secondary | ICD-10-CM | POA: Insufficient documentation

## 2013-12-15 DIAGNOSIS — J029 Acute pharyngitis, unspecified: Secondary | ICD-10-CM | POA: Insufficient documentation

## 2013-12-15 DIAGNOSIS — H6091 Unspecified otitis externa, right ear: Secondary | ICD-10-CM

## 2013-12-15 DIAGNOSIS — Z8719 Personal history of other diseases of the digestive system: Secondary | ICD-10-CM | POA: Insufficient documentation

## 2013-12-15 DIAGNOSIS — Z880 Allergy status to penicillin: Secondary | ICD-10-CM | POA: Insufficient documentation

## 2013-12-15 MED ORDER — CIPROFLOXACIN-DEXAMETHASONE 0.3-0.1 % OT SUSP
4.0000 [drp] | Freq: Two times a day (BID) | OTIC | Status: DC
  Administered 2013-12-15: 4 [drp] via OTIC
  Filled 2013-12-15: qty 7.5

## 2013-12-15 MED ORDER — HYDROCODONE-ACETAMINOPHEN 5-325 MG PO TABS: 2.0000 | ORAL_TABLET | ORAL | Status: DC | PRN

## 2013-12-15 NOTE — ED Provider Notes (Signed)
CSN: [id number]     Arrival date & time 12/15/13  1218 History   First MD Initiated Contact with Patient 12/15/13 1234     Chief Complaint  Patient presents with  . Otalgia  . Cough     (Consider location/radiation/quality/duration/timing/severity/associated sxs/prior Treatment) HPI Comments: Patient is 43 year old female who presents to the ED with a 4 day history of right ear pain - she states that initially the pain was dull and achy, associated with mild sore throat, she reports that the pain was relieved with tylenol and ibuprofen.  Reports that starting about 3 hours ago the pain became sharp and stabbing in nature and is now associated with purulent drainage from the ear,  She denies fever, chills, reports the sore throat is mild in nature.  Mild dry cough.  Has been using OTC ear drops as well without relief.  Patient is a 43 y.o. female presenting with ear pain and cough. The history is provided by the patient. No language interpreter was used.  Otalgia Location:  Right Quality:  Aching and sharp Severity:  Severe Onset quality:  Sudden Duration:  4 days Timing:  Constant Progression:  Worsening Chronicity:  New Context: not direct blow, not elevation change, not foreign body in ear, not loud noise and no water in ear   Relieved by:  Nothing Worsened by:  Nothing tried Ineffective treatments:  None tried Associated symptoms: cough and ear discharge   Associated symptoms: no abdominal pain, no congestion, no diarrhea, no fever, no hearing loss, no rhinorrhea, no sore throat and no vomiting   Risk factors: no recent travel and no chronic ear infection   Cough Associated symptoms: ear pain   Associated symptoms: no fever, no rhinorrhea and no sore throat     Past Medical History  Diagnosis Date  . Pneumonia   . Reflux   . Kidney stones   . Depression   . PTSD (post-traumatic stress disorder)    Past Surgical History  Procedure Laterality Date  . Abdominal surgery      No family history on file. History  Substance Use Topics  . Smoking status: Current Every Day Smoker -- 1.00 packs/day    Types: Cigarettes  . Smokeless tobacco: Never Used  . Alcohol Use: No   OB History   Grav Para Term Preterm Abortions TAB SAB Ect Mult Living                 Review of Systems  Constitutional: Negative for fever.  HENT: Positive for ear discharge and ear pain. Negative for congestion, hearing loss, rhinorrhea and sore throat.   Respiratory: Positive for cough.   Gastrointestinal: Negative for vomiting, abdominal pain and diarrhea.  All other systems reviewed and are negative.      Allergies  Penicillins and Sulfa antibiotics  Home Medications   Current Outpatient Rx  Name  Route  Sig  Dispense  Refill  . HYDROcodone-acetaminophen (NORCO/VICODIN) 5-325 MG per tablet   Oral   Take 2 tablets by mouth every 4 (four) hours as needed.   10 tablet   0   . venlafaxine XR (EFFEXOR-XR) 150 MG 24 hr capsule   Oral   Take 150 mg by mouth daily.          BP 149/93  Pulse 105  Temp(Src) 98.5 F (36.9 C)  Resp 18  SpO2 99%  LMP 12/05/2013 Physical Exam  Nursing note and vitals reviewed. Constitutional: She is oriented to person, place,  and time. She appears well-developed and well-nourished. No distress.  HENT:  Head: Normocephalic and atraumatic.  Left Ear: External ear normal.  Nose: Nose normal.  Mouth/Throat: Oropharynx is clear and moist. No oropharyngeal exudate.  Right ear canal swollen with large amount of purulent exudate, unable to visualize the TM, no posterior pharyngeal erythema or exudate.  Eyes: Conjunctivae are normal. Pupils are equal, round, and reactive to light. No scleral icterus.  Neck: Normal range of motion. Neck supple.  Cardiovascular: Normal rate, regular rhythm and normal heart sounds.  Exam reveals no gallop and no friction rub.   No murmur heard. Pulmonary/Chest: Effort normal and breath sounds normal. No  respiratory distress. She has no wheezes. She has no rales. She exhibits no tenderness.  Abdominal: Soft. Bowel sounds are normal. She exhibits no distension. There is no tenderness.  Musculoskeletal: Normal range of motion. She exhibits no edema and no tenderness.  Lymphadenopathy:    She has no cervical adenopathy.  Neurological: She is alert and oriented to person, place, and time. She exhibits normal muscle tone. Coordination normal.  Skin: Skin is warm and dry. No rash noted. No erythema. No pallor.  Psychiatric: She has a normal mood and affect. Her behavior is normal. Judgment and thought content normal.    ED Course  Procedures (including critical care time) Labs Review Labs Reviewed - No data to display Imaging Review No results found.  EKG Interpretation  None 1:02 PM Ear wick placed and patient started on ciprodex antibiotics here.  Medications  ciprofloxacin-dexamethasone (CIPRODEX) 0.3-0.1 % otic suspension 4 drop (not administered)    MDM   Right otitis externa  Patient here with right ear pain and drainage from the ear c/w otitis externa.  I cannot visualize the right TM though I suspect that she may have perforated the TM and the purulence is from an OM considering the progressive nature of the pain.  She will follow up here in 2 days to remove the ear wick and to re-assess the ear.     Idalia Needle Joelyn Oms, PA-C 12/15/13 1304

## 2013-12-15 NOTE — ED Provider Notes (Signed)
Medical screening examination/treatment/procedure(s) were performed by non-physician practitioner and as supervising physician I was immediately available for consultation/collaboration.  EKG Interpretation  None    Charles B. Karle Starch, MD 12/15/13 1407

## 2013-12-15 NOTE — Discharge Instructions (Signed)
Draining Ear Ear wax, pus, blood and other fluids are examples of the different types of drainage from ears. Drops or cream may be needed to lessen the itching which may occur with ear drainage. CAUSES   Skin irritations in the ear.  Ear infection.  Swimmer's ear.  Ruptured eardrum.  Foreign object in the ear canal.  Sudden pressure changes.  Head injury. HOME CARE INSTRUCTIONS   Only take over-the-counter or prescription medicines for pain, fever, or discomfort as directed by your caregiver.  Do not rub the ear canal with cotton-tipped swabs.  Do not swim until your caregiver says it is okay.  Before you take a shower, cover a cotton ball with petroleum jelly to keep water out.  Limit exposure to smoke. Secondhand smoke can increase the chance for ear infections.  Keep up with immunizations.  Wash your hands well.  Keep all follow-up appointments to examine the ear and evaluate hearing. SEEK MEDICAL CARE IF:   You have increased drainage.  You have ear pain, a fever, or drainage that is not getting better after 48 hours of antibiotics.  You are unusually tired. SEEK IMMEDIATE MEDICAL CARE IF:  You have severe ear pain or headache.  The patient is older than 3 months with a rectal or oral temperature of 102 F (38.9 C) or higher.  The patient is 27 months old or younger with a rectal temperature of 100.4 F (38 C) or higher.  You vomit.  You feel dizzy.  You have a seizure.  You have new hearing loss. MAKE SURE YOU:   Understand these instructions.  Will watch your condition.  Will get help right away if you are not doing well or get worse. Document Released: 10/05/2005 Document Revised: 12/28/2011 Document Reviewed: 08/08/2009 [facility] Patient Information 2014 [place], [place].  Otitis Externa Otitis externa is a bacterial or fungal infection of the outer ear canal. This is the area from the eardrum to the outside of the ear. Otitis externa is  sometimes called "swimmer's ear." CAUSES  Possible causes of infection include:  Swimming in dirty water.  Moisture remaining in the ear after swimming or bathing.  Mild injury (trauma) to the ear.  Objects stuck in the ear (foreign body).  Cuts or scrapes (abrasions) on the outside of the ear. SYMPTOMS  The first symptom of infection is often itching in the ear canal. Later signs and symptoms may include swelling and redness of the ear canal, ear pain, and yellowish-white fluid (pus) coming from the ear. The ear pain may be worse when pulling on the earlobe. DIAGNOSIS  Your caregiver will perform a physical exam. A sample of fluid may be taken from the ear and examined for bacteria or fungi. TREATMENT  Antibiotic ear drops are often given for 10 to 14 days. Treatment may also include pain medicine or corticosteroids to reduce itching and swelling. PREVENTION   Keep your ear dry. Use the corner of a towel to absorb water out of the ear canal after swimming or bathing.  Avoid scratching or putting objects inside your ear. This can damage the ear canal or remove the protective wax that lines the canal. This makes it easier for bacteria and fungi to grow.  Avoid swimming in lakes, polluted water, or poorly chlorinated pools.  You may use ear drops made of rubbing alcohol and vinegar after swimming. Combine equal parts of white vinegar and alcohol in a bottle. Put 3 or 4 drops into each ear after swimming. HOME  CARE INSTRUCTIONS   Apply antibiotic ear drops to the ear canal as prescribed by your caregiver.  Only take over-the-counter or prescription medicines for pain, discomfort, or fever as directed by your caregiver.  If you have diabetes, follow any additional treatment instructions from your caregiver.  Keep all follow-up appointments as directed by your caregiver. SEEK MEDICAL CARE IF:   You have a fever.  Your ear is still red, swollen, painful, or draining pus after 3  days.  Your redness, swelling, or pain gets worse.  You have a severe headache.  You have redness, swelling, pain, or tenderness in the area behind your ear. MAKE SURE YOU:   Understand these instructions.  Will watch your condition.  Will get help right away if you are not doing well or get worse. Document Released: 10/05/2005 Document Revised: 12/28/2011 Document Reviewed: 10/22/2011 [facility] Patient Information 2014 [place].  Ear Drops, Adult You have been diagnosed with a condition requiring you to put drops of medication into your outer ear. HOME CARE INSTRUCTIONS   Put drops in the affected ear as instructed. After putting the drops in, you will need to lay down with the affected ear facing up for ten minutes so the drops will remain in the ear canal and run down and fill the canal. Continue using eardrops for as long as directed by your health care provider.  Prior to getting up, put a cotton ball gently in your ear canal. Leave enough of the ball out so it can be easily removed. Do not attempt to push this down into the canal with a cotton-tipped swab or other instrument.  Do not irrigate or wash out your ears if you have had a perforated eardrum or mastoid surgery, or unless instructed to do so by your health care provider.  Keep appointments with your health care provider as instructed.  Finish all medications, or use for the length of time as instructed. Continue the drops even if your problem seems to be doing well after a couple days, or continue as instructed. SEEK MEDICAL CARE IF:  You become worse or develop increasing pain.  You notice any unusual drainage from your ear (particularly if the drainage stinks).  You develop hearing difficulties.  You experience a serious form of dizziness in which you feel as if the room is spinning, and you feel nauseated (vertigo).  The outside of your ear becomes red or swollen or both. This may be a sign of an  allergic reaction. MAKE SURE YOU:   Understand these instructions.  Will watch your condition.  Will get help right away if you are not doing well or get worse. Document Released: 09/29/2001 Document Revised: 07/26/2013 Document Reviewed: 05/02/2013 [facility] Patient Information 2014 [place], [place].

## 2013-12-15 NOTE — ED Notes (Signed)
Pt c/o right ear pain and drainage with h/o ear infection. Pt also c/o cough x 4 day. Pt using otc ear analgesia drops and ibuprofen.

## 2014-04-21 ENCOUNTER — Encounter (HOSPITAL_BASED_OUTPATIENT_CLINIC_OR_DEPARTMENT_OTHER): Payer: Self-pay | Admitting: Emergency Medicine

## 2014-04-21 ENCOUNTER — Emergency Department (HOSPITAL_BASED_OUTPATIENT_CLINIC_OR_DEPARTMENT_OTHER)
Admission: EM | Admit: 2014-04-21 | Discharge: 2014-04-22 | Disposition: A | Discharge status: Home / Self Care | Payer: Medicaid Other | Attending: Emergency Medicine | Admitting: Emergency Medicine

## 2014-04-21 DIAGNOSIS — Z87442 Personal history of urinary calculi: Secondary | ICD-10-CM | POA: Insufficient documentation

## 2014-04-21 DIAGNOSIS — R1012 Left upper quadrant pain: Secondary | ICD-10-CM | POA: Diagnosis present

## 2014-04-21 DIAGNOSIS — F17200 Nicotine dependence, unspecified, uncomplicated: Secondary | ICD-10-CM | POA: Insufficient documentation

## 2014-04-21 DIAGNOSIS — Z79899 Other long term (current) drug therapy: Secondary | ICD-10-CM | POA: Insufficient documentation

## 2014-04-21 DIAGNOSIS — R110 Nausea: Secondary | ICD-10-CM | POA: Diagnosis not present

## 2014-04-21 DIAGNOSIS — Z8701 Personal history of pneumonia (recurrent): Secondary | ICD-10-CM | POA: Diagnosis not present

## 2014-04-21 DIAGNOSIS — Z9089 Acquired absence of other organs: Secondary | ICD-10-CM | POA: Insufficient documentation

## 2014-04-21 DIAGNOSIS — F4310 Post-traumatic stress disorder, unspecified: Secondary | ICD-10-CM | POA: Diagnosis not present

## 2014-04-21 DIAGNOSIS — Z8719 Personal history of other diseases of the digestive system: Secondary | ICD-10-CM | POA: Insufficient documentation

## 2014-04-21 DIAGNOSIS — R142 Eructation: Secondary | ICD-10-CM

## 2014-04-21 DIAGNOSIS — R141 Gas pain: Secondary | ICD-10-CM | POA: Diagnosis not present

## 2014-04-21 DIAGNOSIS — F329 Major depressive disorder, single episode, unspecified: Secondary | ICD-10-CM | POA: Insufficient documentation

## 2014-04-21 DIAGNOSIS — R638 Other symptoms and signs concerning food and fluid intake: Secondary | ICD-10-CM | POA: Insufficient documentation

## 2014-04-21 DIAGNOSIS — R143 Flatulence: Secondary | ICD-10-CM

## 2014-04-21 DIAGNOSIS — F3289 Other specified depressive episodes: Secondary | ICD-10-CM | POA: Diagnosis not present

## 2014-04-21 DIAGNOSIS — Z9889 Other specified postprocedural states: Secondary | ICD-10-CM | POA: Insufficient documentation

## 2014-04-21 DIAGNOSIS — G8929 Other chronic pain: Secondary | ICD-10-CM | POA: Insufficient documentation

## 2014-04-21 DIAGNOSIS — R1084 Generalized abdominal pain: Secondary | ICD-10-CM | POA: Diagnosis not present

## 2014-04-21 DIAGNOSIS — Z3202 Encounter for pregnancy test, result negative: Secondary | ICD-10-CM | POA: Insufficient documentation

## 2014-04-21 DIAGNOSIS — Z880 Allergy status to penicillin: Secondary | ICD-10-CM | POA: Insufficient documentation

## 2014-04-21 HISTORY — DX: Unspecified intestinal obstruction, unspecified as to partial versus complete obstruction: K56.609

## 2014-04-21 LAB — CBC WITH DIFFERENTIAL/PLATELET
BASOS PCT: 1 % (ref 0–1)
Basophils Absolute: 0.1 10*3/uL (ref 0.0–0.1)
Eosinophils Absolute: 0.2 10*3/uL (ref 0.0–0.7)
Eosinophils Relative: 2 % (ref 0–5)
HCT: 40.1 % (ref 36.0–46.0)
HEMOGLOBIN: 14.1 g/dL (ref 12.0–15.0)
Lymphocytes Relative: 32 % (ref 12–46)
Lymphs Abs: 3.7 10*3/uL (ref 0.7–4.0)
MCH: 31.1 pg (ref 26.0–34.0)
MCHC: 35.2 g/dL (ref 30.0–36.0)
MCV: 88.5 fL (ref 78.0–100.0)
MONOS PCT: 8 % (ref 3–12)
Monocytes Absolute: 0.9 10*3/uL (ref 0.1–1.0)
NEUTROS ABS: 6.5 10*3/uL (ref 1.7–7.7)
NEUTROS PCT: 57 % (ref 43–77)
Platelets: 363 10*3/uL (ref 150–400)
RBC: 4.53 MIL/uL (ref 3.87–5.11)
RDW: 12.9 % (ref 11.5–15.5)
WBC: 11.4 10*3/uL — ABNORMAL HIGH (ref 4.0–10.5)

## 2014-04-21 MED ORDER — SODIUM CHLORIDE 0.9 % IV BOLUS (SEPSIS)
500.0000 mL | Freq: Once | INTRAVENOUS | Status: AC
  Administered 2014-04-22: 500 mL via INTRAVENOUS

## 2014-04-21 NOTE — ED Notes (Signed)
Pt reports LUQ Abdominal Pain that started 6 days ago - Pt reports nausea, loose stools, loss of appetite and abdominal distention.

## 2014-04-21 NOTE — ED Provider Notes (Signed)
CSN: [id number]     Arrival date & time 04/21/14  2318 History  This chart was scribed for Shaune Pollack, MD by Jeanell Sparrow, ED Scribe. This patient was seen in room MH09/MH09 and the patient's care was started at 11:51 PM.   Chief Complaint  Patient presents with  . Abdominal Pain    Patient is a 43 y.o. female presenting with abdominal pain. The history is provided by the patient. No language interpreter was used.  Abdominal Pain Pain location:  LUQ and LLQ Pain radiates to:  Does not radiate Pain severity:  Moderate Duration:  7 days Timing:  Intermittent Associated symptoms: nausea    HPI Comments: Debra Barnes is a 43 y.o. female with a hx of bowel obstruction who presents to the Emergency Department complaining of moderate intermittent upper left upper and lower quadrant abdominal pain that started 6 days ago. She reports associated abdominal distension, watery stool, appetite loss and nausea. She states that she has a hx of smoking, but does not drink. She reports that she has an allergy to sulfa drug.   No PCP Past Medical History  Diagnosis Date  . Pneumonia   . Reflux   . Kidney stones   . Depression   . PTSD (post-traumatic stress disorder)   . Bowel obstruction    Past Surgical History  Procedure Laterality Date  . Abdominal surgery    . Appendectomy     History reviewed. No pertinent family history. History  Substance Use Topics  . Smoking status: Current Every Day Smoker -- 1.00 packs/day    Types: Cigarettes  . Smokeless tobacco: Never Used  . Alcohol Use: No   OB History   Grav Para Term Preterm Abortions TAB SAB Ect Mult Living                 Review of Systems  Constitutional: Positive for appetite change.  Gastrointestinal: Positive for nausea, abdominal pain and abdominal distention.  All other systems reviewed and are negative.   Allergies  Penicillins and Sulfa antibiotics  Home Medications   Prior to Admission medications    Medication Sig Start Date End Date Taking? Authorizing Provider  venlafaxine XR (EFFEXOR-XR) 150 MG 24 hr capsule Take 150 mg by mouth daily.   Yes Historical Provider, MD  HYDROcodone-acetaminophen (NORCO/VICODIN) 5-325 MG per tablet Take 2 tablets by mouth every 4 (four) hours as needed. 12/15/13   Idalia Needle. [place], PA-C   BP 134/77  Pulse 94  Temp(Src) 98.8 F (37.1 C) (Oral)  Resp 23  Ht 5' 2.5" (1.588 m)  Wt 168 lb (76.204 kg)  BMI 30.22 kg/m2  SpO2 100%  LMP 03/21/2014 Physical Exam  Nursing note and vitals reviewed. Constitutional: She is oriented to person, place, and time. She appears well-developed and well-nourished. No distress.  HENT:  Head: Normocephalic and atraumatic.  Neck: Neck supple. No tracheal deviation present.  Cardiovascular: Normal rate.   Pulmonary/Chest: Effort normal. No respiratory distress.  Abdominal: She exhibits distension. There is tenderness.  Profusely and moderately TTP  Musculoskeletal: Normal range of motion.  Neurological: She is alert and oriented to person, place, and time.  Skin: Skin is warm and dry.  Psychiatric: She has a normal mood and affect. Her behavior is normal.    ED Course  Procedures (including critical care time) DIAGNOSTIC STUDIES: Oxygen Saturation is 100% on RA, normal by my interpretation.    COORDINATION OF CARE: 11:55 PM- Pt advised of plan for treatment which  includes medication, radiology, and labs and pt agrees.  Labs Review Labs Reviewed  COMPREHENSIVE METABOLIC PANEL - Abnormal; Notable for the following:    Potassium 3.5 (*)    Glucose, Bld 103 (*)    All other components within normal limits  CBC WITH DIFFERENTIAL - Abnormal; Notable for the following:    WBC 11.4 (*)    All other components within normal limits  URINALYSIS, ROUTINE W REFLEX MICROSCOPIC - Abnormal; Notable for the following:    APPearance CLOUDY (*)    Bilirubin Urine SMALL (*)    Leukocytes, UA MODERATE (*)    All other  components within normal limits  URINE MICROSCOPIC-ADD ON - Abnormal; Notable for the following:    Squamous Epithelial / LPF MANY (*)    Bacteria, UA MANY (*)    All other components within normal limits  PREGNANCY, URINE  LIPASE, BLOOD    Imaging Review Ct Abdomen Pelvis W Contrast  04/22/2014   CLINICAL DATA:  Upper left abdominal pain.  EXAM: CT ABDOMEN AND PELVIS WITH CONTRAST  TECHNIQUE: Multidetector CT imaging of the abdomen and pelvis was performed using the standard protocol following bolus administration of intravenous contrast.  CONTRAST:  78mL OMNIPAQUE IOHEXOL 300 MG/ML SOLN, 114mL OMNIPAQUE IOHEXOL 300 MG/ML SOLN  COMPARISON:  Prior CT from 06/08/2008.  FINDINGS: Mild atelectatic changes noted within the lingula and right middle lobe. The visualized lung bases are otherwise clear.  The liver demonstrates a normal contrast enhanced appearance. Focal fat deposition noted adjacent to the fissure for ligamentum teres. Gallbladder within normal limits. No biliary ductal dilatation. The spleen, adrenal glands, and pancreas demonstrate a normal contrast enhanced appearance.  Kidneys are equal in size with symmetric enhancement. No nephrolithiasis, hydronephrosis, or focal enhancing renal mass. Small exophytic simple cyst noted extending from the lower pole of the left kidney additional subcentimeter hypodensity within the lower pole the right kidney is too small the characterize, but statistically likely represents a small cyst.  Stomach within normal limits. No evidence of bowel obstruction. Appendix is not visualized, compatible with prior appendectomy. The cecum is somewhat low lying within the pelvis. No abnormal wall thickening, mucosal enhancement, or inflammatory fat stranding seen about the bowels.  Bladder is incompletely distended but grossly normal. Uterus and right ovary within normal limits. Corpus luteal cyst noted within the left ovary.  Small volume free fluid present within the  pelvis, likely physiologic.  No free intraperitoneal air. No enlarged intra-abdominal pelvic lymph nodes. Normal intravascular enhancement seen throughout the abdomen and pelvis.  No acute osseous abnormality. No worrisome lytic or blastic osseous lesions.  IMPRESSION: 1. No CT evidence of acute intra-abdominal or pelvic process. 2. Status post appendectomy. 3. Left ovarian corpus luteal cyst with small volume free fluid within the pelvis, likely physiologic.   Electronically Signed   By: Jeannine Boga M.D.   On: 04/22/2014 01:46     EKG Interpretation None      MDM   Final diagnoses:  Generalized abdominal pain    43 y.o. Female with history of chronic abdominal pain and multiple small bowel obstructions presents today with complaints of pain similar to prior sbos.  CT reveals no acute abnormalities and labs with only mild abnormalities.  Patient somewhat improved and no vomiting or diarrhea here.  Discussed need for follow up and return precautions and patient voices understanding.     Shaune Pollack, MD 04/22/14 [phone]

## 2014-04-22 ENCOUNTER — Emergency Department (HOSPITAL_BASED_OUTPATIENT_CLINIC_OR_DEPARTMENT_OTHER): Payer: Medicaid Other

## 2014-04-22 LAB — COMPREHENSIVE METABOLIC PANEL
ALK PHOS: 107 U/L (ref 39–117)
ALT: 18 U/L (ref 0–35)
ANION GAP: 15 (ref 5–15)
AST: 20 U/L (ref 0–37)
Albumin: 3.9 g/dL (ref 3.5–5.2)
BUN: 11 mg/dL (ref 6–23)
CO2: 22 meq/L (ref 19–32)
Calcium: 9.6 mg/dL (ref 8.4–10.5)
Chloride: 102 mEq/L (ref 96–112)
Creatinine, Ser: 0.7 mg/dL (ref 0.50–1.10)
GLUCOSE: 103 mg/dL — AB (ref 70–99)
POTASSIUM: 3.5 meq/L — AB (ref 3.7–5.3)
Sodium: 139 mEq/L (ref 137–147)
TOTAL PROTEIN: 8 g/dL (ref 6.0–8.3)
Total Bilirubin: 0.6 mg/dL (ref 0.3–1.2)

## 2014-04-22 LAB — URINALYSIS, ROUTINE W REFLEX MICROSCOPIC
Glucose, UA: NEGATIVE mg/dL
HGB URINE DIPSTICK: NEGATIVE
Ketones, ur: NEGATIVE mg/dL
Nitrite: NEGATIVE
Protein, ur: NEGATIVE mg/dL
SPECIFIC GRAVITY, URINE: 1.023 (ref 1.005–1.030)
UROBILINOGEN UA: 1 mg/dL (ref 0.0–1.0)
pH: 5.5 (ref 5.0–8.0)

## 2014-04-22 LAB — URINE MICROSCOPIC-ADD ON

## 2014-04-22 LAB — PREGNANCY, URINE: PREG TEST UR: NEGATIVE

## 2014-04-22 LAB — LIPASE, BLOOD: Lipase: 35 U/L (ref 11–59)

## 2014-04-22 MED ORDER — IOHEXOL 300 MG/ML  SOLN
100.0000 mL | Freq: Once | INTRAMUSCULAR | Status: AC | PRN
  Administered 2014-04-22: 100 mL via INTRAVENOUS

## 2014-04-22 MED ORDER — IOHEXOL 300 MG/ML  SOLN
50.0000 mL | Freq: Once | INTRAMUSCULAR | Status: AC | PRN
  Administered 2014-04-22: 50 mL via ORAL

## 2014-04-22 NOTE — Discharge Instructions (Signed)
Abdominal Pain, Women °Abdominal (stomach, pelvic, or belly) pain can be caused by many things. It is important to tell your doctor: °· The location of the pain. °· Does it come and go or is it present all the time? °· Are there things that start the pain (eating certain foods, exercise)? °· Are there other symptoms associated with the pain (fever, nausea, vomiting, diarrhea)? °All of this is helpful to know when trying to find the cause of the pain. °CAUSES  °· Stomach: virus or bacteria infection, or ulcer. °· Intestine: appendicitis (inflamed appendix), regional ileitis (Crohn's disease), ulcerative colitis (inflamed colon), irritable bowel syndrome, diverticulitis (inflamed diverticulum of the colon), or cancer of the stomach or intestine. °· Gallbladder disease or stones in the gallbladder. °· Kidney disease, kidney stones, or infection. °· Pancreas infection or cancer. °· Fibromyalgia (pain disorder). °· Diseases of the female organs: °¨ Uterus: fibroid (non-cancerous) tumors or infection. °¨ Fallopian tubes: infection or tubal pregnancy. °¨ Ovary: cysts or tumors. °¨ Pelvic adhesions (scar tissue). °¨ Endometriosis (uterus lining tissue growing in the pelvis and on the pelvic organs). °¨ Pelvic congestion syndrome (female organs filling up with blood just before the menstrual period). °¨ Pain with the menstrual period. °¨ Pain with ovulation (producing an egg). °¨ Pain with an IUD (intrauterine device, birth control) in the uterus. °¨ Cancer of the female organs. °· Functional pain (pain not caused by a disease, may improve without treatment). °· Psychological pain. °· Depression. °DIAGNOSIS  °Your doctor will decide the seriousness of your pain by doing an examination. °· Blood tests. °· X-rays. °· Ultrasound. °· CT scan (computed tomography, special type of X-Trinidi Toppins). °· MRI (magnetic resonance imaging). °· Cultures, for infection. °· Barium enema (dye inserted in the large intestine, to better view it with  X-rays). °· Colonoscopy (looking in intestine with a lighted tube). °· Laparoscopy (minor surgery, looking in abdomen with a lighted tube). °· Major abdominal exploratory surgery (looking in abdomen with a large incision). °TREATMENT  °The treatment will depend on the cause of the pain.  °· Many cases can be observed and treated at home. °· Over-the-counter medicines recommended by your caregiver. °· Prescription medicine. °· Antibiotics, for infection. °· Birth control pills, for painful periods or for ovulation pain. °· Hormone treatment, for endometriosis. °· Nerve blocking injections. °· Physical therapy. °· Antidepressants. °· Counseling with a psychologist or psychiatrist. °· Minor or major surgery. °HOME CARE INSTRUCTIONS  °· Do not take laxatives, unless directed by your caregiver. °· Take over-the-counter pain medicine only if ordered by your caregiver. Do not take aspirin because it can cause an upset stomach or bleeding. °· Try a clear liquid diet (broth or water) as ordered by your caregiver. Slowly move to a bland diet, as tolerated, if the pain is related to the stomach or intestine. °· Have a thermometer and take your temperature several times a day, and record it. °· Bed rest and sleep, if it helps the pain. °· Avoid sexual intercourse, if it causes pain. °· Avoid stressful situations. °· Keep your follow-up appointments and tests, as your caregiver orders. °· If the pain does not go away with medicine or surgery, you may try: °¨ Acupuncture. °¨ Relaxation exercises (yoga, meditation). °¨ Group therapy. °¨ Counseling. °SEEK MEDICAL CARE IF:  °· You notice certain foods cause stomach pain. °· Your home care treatment is not helping your pain. °· You need stronger pain medicine. °· You want your IUD removed. °· You feel faint or   lightheaded. °· You develop nausea and vomiting. °· You develop a rash. °· You are having side effects or an allergy to your medicine. °SEEK IMMEDIATE MEDICAL CARE IF:  °· Your  pain does not go away or gets worse. °· You have a fever. °· Your pain is felt only in portions of the abdomen. The right side could possibly be appendicitis. The left lower portion of the abdomen could be colitis or diverticulitis. °· You are passing blood in your stools (bright red or black tarry stools, with or without vomiting). °· You have blood in your urine. °· You develop chills, with or without a fever. °· You pass out. °MAKE SURE YOU:  °· Understand these instructions. °· Will watch your condition. °· Will get help right away if you are not doing well or get worse. °Document Released: 08/02/2007 Document Revised: 12/28/2011 Document Reviewed: 08/22/2009 °[employer]® Patient Information ©2015 [employer]. This information is not intended to replace advice given to you by your health care provider. Make sure you discuss any questions you have with your health care provider. ° °

## 2018-02-18 ENCOUNTER — Other Ambulatory Visit: Payer: Self-pay

## 2018-02-18 ENCOUNTER — Emergency Department (HOSPITAL_BASED_OUTPATIENT_CLINIC_OR_DEPARTMENT_OTHER)
Admission: EM | Admit: 2018-02-18 | Discharge: 2018-02-19 | Disposition: A | Discharge status: Home / Self Care | Payer: Self-pay | Attending: Emergency Medicine | Admitting: Emergency Medicine

## 2018-02-18 ENCOUNTER — Encounter (HOSPITAL_BASED_OUTPATIENT_CLINIC_OR_DEPARTMENT_OTHER): Payer: Self-pay | Admitting: Adult Health

## 2018-02-18 DIAGNOSIS — F17210 Nicotine dependence, cigarettes, uncomplicated: Secondary | ICD-10-CM | POA: Insufficient documentation

## 2018-02-18 DIAGNOSIS — N201 Calculus of ureter: Secondary | ICD-10-CM | POA: Insufficient documentation

## 2018-02-18 DIAGNOSIS — Z79899 Other long term (current) drug therapy: Secondary | ICD-10-CM | POA: Insufficient documentation

## 2018-02-18 NOTE — ED Triage Notes (Signed)
Presents with RLQ and back pain that began 50 minutes ago, she believes she has a kidney stone, Endorses vomiting x10. Pt is unable to sit still

## 2018-02-19 ENCOUNTER — Emergency Department (HOSPITAL_BASED_OUTPATIENT_CLINIC_OR_DEPARTMENT_OTHER): Payer: Self-pay

## 2018-02-19 LAB — URINALYSIS, MICROSCOPIC (REFLEX)

## 2018-02-19 LAB — URINALYSIS, ROUTINE W REFLEX MICROSCOPIC
Glucose, UA: NEGATIVE mg/dL
Ketones, ur: 15 mg/dL — AB
Leukocytes, UA: NEGATIVE
Nitrite: NEGATIVE
Protein, ur: 100 mg/dL — AB
Specific Gravity, Urine: >1.03 — ABNORMAL HIGH (ref 1.005–1.030)
pH: 5.5 (ref 5.0–8.0)

## 2018-02-19 LAB — PREGNANCY, URINE: PREG TEST UR: NEGATIVE

## 2018-02-19 MED ORDER — HYDROMORPHONE HCL 4 MG PO TABS: 4.0000 mg | ORAL_TABLET | ORAL | 0 refills | Status: AC | PRN

## 2018-02-19 MED ORDER — ONDANSETRON 8 MG PO TBDP: 8.0000 mg | ORAL_TABLET | Freq: Three times a day (TID) | ORAL | 0 refills | Status: AC | PRN

## 2018-02-19 MED ORDER — HYDROMORPHONE HCL 1 MG/ML IJ SOLN
1.0000 mg | Freq: Once | INTRAMUSCULAR | Status: AC
  Administered 2018-02-19: 1 mg via INTRAVENOUS
  Filled 2018-02-19: qty 1

## 2018-02-19 MED ORDER — ONDANSETRON HCL 4 MG/2ML IJ SOLN
4.0000 mg | Freq: Once | INTRAMUSCULAR | Status: AC
  Administered 2018-02-19: 4 mg via INTRAVENOUS
  Filled 2018-02-19: qty 2

## 2018-02-19 NOTE — ED Provider Notes (Addendum)
[facility] Provider Note: Georgena Spurling, MD, FACEP  CSN: [id number] MRN: [id number] ARRIVAL: 02/18/18 at 2332 ROOM: [place]  Abdominal Pain   HISTORY OF PRESENT ILLNESS  02/19/18 12:02 AM Debra Barnes is a 47 y.o. female with history of kidney stones.  She is here with right flank pain radiating to her right lower quadrant.  The pain began about 24 hours ago but acutely worsened over the last hour.  It is now severe.  It is not significantly changed with movement or palpation.  She has had associated vomiting which she estimates as 10 episodes.  She is also having a dark urine.  Pain is similar to previous kidney stones but worse.  Consultation with the [facility] state controlled substances database reveals the patient has received 1 prescription for hydrocodone in the past 2 years.   Past Medical History:  Diagnosis Date  . Bowel obstruction ([place])   . Depression   . Kidney stones   . Pneumonia   . PTSD (post-traumatic stress disorder)   . Reflux     Past Surgical History:  Procedure Laterality Date  . ABDOMINAL SURGERY    . APPENDECTOMY      History reviewed. No pertinent family history.  Social History   Tobacco Use  . Smoking status: Current Every Day Smoker    Packs/day: 1.00    Types: Cigarettes  . Smokeless tobacco: Never Used  Substance Use Topics  . Alcohol use: No  . Drug use: No    Prior to Admission medications   Medication Sig Start Date End Date Taking? Authorizing Provider  HYDROcodone-acetaminophen (NORCO/VICODIN) 5-325 MG per tablet Take 2 tablets by mouth every 4 (four) hours as needed. 12/15/13   Ignacia Felling, PA-C  venlafaxine XR (EFFEXOR-XR) 150 MG 24 hr capsule Take 150 mg by mouth daily.    [provider]    Allergies Penicillins and Sulfa antibiotics   REVIEW OF SYSTEMS  Negative except as noted here or in the History of Present Illness.   PHYSICAL EXAMINATION  Initial Vital  Signs Blood pressure (!) 162/121, pulse 95, temperature 98.1 F (36.7 C), temperature source Oral, resp. rate (!) 24, height 5\' 2"  (1.575 m), weight 69.9 kg (154 lb), last menstrual period 01/19/2018, SpO2 100 %.  Examination General: Well-developed, well-nourished female in apparent discomfort; appearance consistent with age of record HENT: normocephalic; atraumatic Eyes: Normal appearance Neck: supple Heart: regular rate and rhythm Lungs: clear to auscultation bilaterally Abdomen: soft; nondistended; mild right lower quadrant tenderness; bowel sounds present Extremities: No deformity; full range of motion; pulses normal Neurologic: Awake, alert; motor function intact in all extremities and symmetric; no facial droop Skin: Warm and dry Psychiatric: Agitated   RESULTS  Summary of this visit's results, reviewed by myself:   EKG Interpretation  Date/Time:    Ventricular Rate:    PR Interval:    QRS Duration:   QT Interval:    QTC Calculation:   R Axis:     Text Interpretation:        Laboratory Studies: Results for orders placed or performed during the hospital encounter of 02/18/18 (from the past 24 hour(s))  Urinalysis, Routine w reflex microscopic     Status: Abnormal   Collection Time: 02/18/18 11:52 PM  Result Value Ref Range   Color, Urine AMBER (A) YELLOW   APPearance CLOUDY (A) CLEAR   Specific Gravity, Urine >1.030 (H) 1.005 - 1.030   pH 5.5 5.0 -  8.0   Glucose, UA NEGATIVE NEGATIVE mg/dL   Hgb urine dipstick LARGE (A) NEGATIVE   Bilirubin Urine SMALL (A) NEGATIVE   Ketones, ur 15 (A) NEGATIVE mg/dL   Protein, ur 100 (A) NEGATIVE mg/dL   Nitrite NEGATIVE NEGATIVE   Leukocytes, UA NEGATIVE NEGATIVE  Pregnancy, urine     Status: None   Collection Time: 02/18/18 11:52 PM  Result Value Ref Range   Preg Test, Ur NEGATIVE NEGATIVE  Urinalysis, Microscopic (reflex)     Status: Abnormal   Collection Time: 02/18/18 11:52 PM  Result Value Ref Range   RBC / HPF  >50 0 - 5 RBC/hpf   WBC, UA 0-5 0 - 5 WBC/hpf   Bacteria, UA FEW (A) NONE SEEN   Squamous Epithelial / LPF 0-5 0 - 5   Mucus PRESENT    Amorphous Crystal PRESENT    Ca Oxalate Crys, UA PRESENT    Imaging Studies: Ct Renal Stone Study  Result Date: 02/19/2018 CLINICAL DATA:  Right lower quadrant and back pain EXAM: CT ABDOMEN AND PELVIS WITHOUT CONTRAST TECHNIQUE: Multidetector CT imaging of the abdomen and pelvis was performed following the standard protocol without IV contrast. COMPARISON:  11/16/2016 FINDINGS: Lower chest: Lung bases demonstrate no acute consolidation or pleural effusion. Heart size within normal limits. Hepatobiliary: Focal fat infiltration near the falciform ligament. No calcified gallstones. Prominent extrahepatic bile duct up to 9 mm at the porta hepatis. This does not appear significantly changed. Pancreas: Unremarkable. No pancreatic ductal dilatation or surrounding inflammatory changes. Spleen: Normal in size without focal abnormality. Adrenals/Urinary Tract: Adrenal glands are within normal limits. Punctate nonobstructing stones in the upper pole left kidney. Small exophytic cyst lower pole left kidney. Punctate stone in the lower pole of the right kidney. Enlarged right kidney with moderate perinephric edema. Moderate right hydronephrosis and hydroureter, secondary to 2 stones within the distal right ureter about 2-3 cm proximal to the UVJ. The more cephalad larger stone measures 5 x 7 mm. The smaller more distal stone measures 3 mm. Stomach/Bowel: Stomach is within normal limits. Status post appendectomy. Postsurgical changes of the small bowel within the anterior abdomen without obstructive pattern. No evidence of bowel wall thickening, distention, or inflammatory changes. Vascular/Lymphatic: Nonaneurysmal aorta. No significantly enlarged lymph nodes Reproductive: Uterus and bilateral adnexa are unremarkable. Other: Negative for free air or free fluid. Musculoskeletal: Mild  chronic superior endplate deformity at M08. IMPRESSION: 1. Enlarged right kidney with moderate right hydronephrosis and hydroureter, secondary to 2 stones within the distal right ureter about 2-3 cm proximal to the right UVJ. Larger more cephalad stone measures 5 x 7 mm, smaller distal stone measures 3 mm. 2. There are bilateral intrarenal calculi. 3. Similar appearance of prominent extrahepatic bile duct up to 9 mm Electronically Signed   By: Donavan Foil M.D.   On: 02/19/2018 00:45    ED COURSE and MDM  Nursing notes and initial vitals signs, including pulse oximetry, reviewed.  Vitals:   02/18/18 2342  BP: (!) 162/121  Pulse: 95  Resp: (!) 24  Temp: 98.1 F (36.7 C)  TempSrc: Oral  SpO2: 100%  Weight: 69.9 kg (154 lb)  Height: 5\' 2"  (1.575 m)   1:10 AM Pain well controlled at this time.  Patient advised of CT findings of ureterolithiasis.  We will refer her to urology for definitive treatment as the larger stone may not pass on its own.  PROCEDURES    ED DIAGNOSES     ICD-10-CM   1.  Ureterolithiasis N20.1        Louan Base, MD 02/19/18 0111    Shanon Rosser, MD 02/19/18 [phone]

## 2018-02-19 NOTE — ED Notes (Signed)
Pt. Reports she felt like she was getting her period last night so the abd. Pain she was feeling she thought may be coming from that.  Pt. Said she then began to have more pain on Friday evening with bouts of vomiting.  Pt. Said she could not get comfortable and had to come to ED for pain relief.  Pt. Also reports she had blood in her urine.
